# Patient Record
Sex: Male | Born: 1980 | Race: Black or African American | Hispanic: No | Marital: Single | Smoking: Current every day smoker
Health system: Southern US, Community
[De-identification: ages and names within clinical notes are randomized; demographics above are authoritative.]

## PROBLEM LIST (undated history)

## (undated) DIAGNOSIS — E119 Type 2 diabetes mellitus without complications: Secondary | ICD-10-CM

---

## 2013-07-30 ENCOUNTER — Emergency Department (HOSPITAL_COMMUNITY): Payer: Self-pay

## 2013-07-30 ENCOUNTER — Encounter (HOSPITAL_COMMUNITY): Payer: Self-pay | Admitting: Emergency Medicine

## 2013-07-30 ENCOUNTER — Emergency Department (HOSPITAL_COMMUNITY)
Admission: EM | Admit: 2013-07-30 | Discharge: 2013-07-31 | Disposition: A | Discharge status: Home / Self Care | Payer: Self-pay | Attending: Emergency Medicine | Admitting: Emergency Medicine

## 2013-07-30 DIAGNOSIS — F17200 Nicotine dependence, unspecified, uncomplicated: Secondary | ICD-10-CM | POA: Insufficient documentation

## 2013-07-30 DIAGNOSIS — E119 Type 2 diabetes mellitus without complications: Secondary | ICD-10-CM | POA: Insufficient documentation

## 2013-07-30 DIAGNOSIS — S20219A Contusion of unspecified front wall of thorax, initial encounter: Secondary | ICD-10-CM | POA: Insufficient documentation

## 2013-07-30 DIAGNOSIS — E860 Dehydration: Secondary | ICD-10-CM | POA: Insufficient documentation

## 2013-07-30 DIAGNOSIS — S0990XA Unspecified injury of head, initial encounter: Secondary | ICD-10-CM | POA: Insufficient documentation

## 2013-07-30 HISTORY — DX: Type 2 diabetes mellitus without complications: E11.9

## 2013-07-30 LAB — CBC WITH DIFFERENTIAL/PLATELET
Basophils Absolute: 0 10*3/uL (ref 0.0–0.1)
Basophils Relative: 1 % (ref 0–1)
EOS ABS: 0 10*3/uL (ref 0.0–0.7)
EOS PCT: 1 % (ref 0–5)
HCT: 41.6 % (ref 39.0–52.0)
Hemoglobin: 15.2 g/dL (ref 13.0–17.0)
LYMPHS PCT: 30 % (ref 12–46)
Lymphs Abs: 2.5 10*3/uL (ref 0.7–4.0)
MCH: 33.5 pg (ref 26.0–34.0)
MCHC: 36.5 g/dL — ABNORMAL HIGH (ref 30.0–36.0)
MCV: 91.6 fL (ref 78.0–100.0)
Monocytes Absolute: 0.7 10*3/uL (ref 0.1–1.0)
Monocytes Relative: 8 % (ref 3–12)
Neutro Abs: 5.1 10*3/uL (ref 1.7–7.7)
Neutrophils Relative %: 60 % (ref 43–77)
PLATELETS: 216 10*3/uL (ref 150–400)
RBC: 4.54 MIL/uL (ref 4.22–5.81)
RDW: 13.5 % (ref 11.5–15.5)
WBC: 8.4 10*3/uL (ref 4.0–10.5)

## 2013-07-30 LAB — COMPREHENSIVE METABOLIC PANEL
ALT: 10 U/L (ref 0–53)
AST: 72 U/L — AB (ref 0–37)
Albumin: 4.3 g/dL (ref 3.5–5.2)
Alkaline Phosphatase: 45 U/L (ref 39–117)
BUN: 13 mg/dL (ref 6–23)
CALCIUM: 9.4 mg/dL (ref 8.4–10.5)
CO2: 21 mEq/L (ref 19–32)
Chloride: 92 mEq/L — ABNORMAL LOW (ref 96–112)
Creatinine, Ser: 0.77 mg/dL (ref 0.50–1.35)
GFR calc Af Amer: >90 mL/min (ref >90)
GFR calc non Af Amer: >90 mL/min (ref >90)
Glucose, Bld: 328 mg/dL — ABNORMAL HIGH (ref 70–99)
Potassium: 5.9 mEq/L — ABNORMAL HIGH (ref 3.7–5.3)
SODIUM: 128 meq/L — AB (ref 137–147)
TOTAL PROTEIN: 8.3 g/dL (ref 6.0–8.3)
Total Bilirubin: 0.6 mg/dL (ref 0.3–1.2)

## 2013-07-30 LAB — RAPID URINE DRUG SCREEN, HOSP PERFORMED
AMPHETAMINES: NOT DETECTED
BENZODIAZEPINES: NOT DETECTED
Barbiturates: NOT DETECTED
COCAINE: POSITIVE — AB
Opiates: NOT DETECTED
TETRAHYDROCANNABINOL: NOT DETECTED

## 2013-07-30 LAB — ETHANOL: Alcohol, Ethyl (B): <11 mg/dL (ref 0–11)

## 2013-07-30 LAB — I-STAT TROPONIN, ED: TROPONIN I, POC: 0 ng/mL (ref 0.00–0.08)

## 2013-07-30 MED ORDER — ACETAMINOPHEN 325 MG PO TABS
650.0000 mg | ORAL_TABLET | Freq: Once | ORAL | Status: AC
  Administered 2013-07-30: 650 mg via ORAL
  Filled 2013-07-30: qty 2

## 2013-07-30 MED ORDER — SODIUM CHLORIDE 0.9 % IV BOLUS (SEPSIS)
1000.0000 mL | Freq: Once | INTRAVENOUS | Status: AC
  Administered 2013-07-30: 1000 mL via INTRAVENOUS

## 2013-07-30 NOTE — ED Notes (Signed)
Phlebotomy at bedside.
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
## 2013-07-30 NOTE — ED Notes (Signed)
Patient transported to X-ray 
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
## 2013-07-31 LAB — BASIC METABOLIC PANEL
BUN: 11 mg/dL (ref 6–23)
CALCIUM: 8.4 mg/dL (ref 8.4–10.5)
CO2: 17 mEq/L — ABNORMAL LOW (ref 19–32)
CREATININE: 0.7 mg/dL (ref 0.50–1.35)
Chloride: 96 mEq/L (ref 96–112)
Glucose, Bld: 180 mg/dL — ABNORMAL HIGH (ref 70–99)
Potassium: 4.7 mEq/L (ref 3.7–5.3)
SODIUM: 131 meq/L — AB (ref 137–147)

## 2013-07-31 LAB — CK TOTAL AND CKMB (NOT AT ARMC)
CK, MB: 3.7 ng/mL (ref 0.3–4.0)
RELATIVE INDEX: 0.7 (ref 0.0–2.5)
Total CK: 535 U/L — ABNORMAL HIGH (ref 7–232)

## 2013-07-31 MED ORDER — IBUPROFEN 800 MG PO TABS
800.0000 mg | ORAL_TABLET | Freq: Once | ORAL | Status: AC
  Administered 2013-07-31: 800 mg via ORAL
  Filled 2013-07-31: qty 1
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
## 2013-07-31 NOTE — ED Notes (Signed)
Pt verbalizes understanding of d/c instructions and denies any further needs at this time. 

## 2013-08-01 MED ORDER — METFORMIN HCL 500 MG PO TABS: 500.0000 mg | ORAL_TABLET | Freq: Two times a day (BID) | ORAL | Status: AC

## 2013-08-02 NOTE — ED Provider Notes (Signed)
Medical screening examination/treatment/procedure(s) were performed by non-physician practitioner and as supervising physician I was immediately available for consultation/collaboration.  Flint MelterElliott L Uri Turnbough, MD 08/02/13 [phone]
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
